# Patient Record
Sex: Male | Born: 1955 | Hispanic: Yes | Smoking: Former smoker
Health system: Southern US, Community
[De-identification: ages and names within clinical notes are randomized; demographics above are authoritative.]

## PROBLEM LIST (undated history)

## (undated) ENCOUNTER — Ambulatory Visit: Payer: Medicare Other

## (undated) DIAGNOSIS — I10 Essential (primary) hypertension: Secondary | ICD-10-CM

## (undated) HISTORY — PX: CARDIAC SURGERY: SHX584

---

## 2010-03-11 DIAGNOSIS — I219 Acute myocardial infarction, unspecified: Secondary | ICD-10-CM

## 2010-03-11 HISTORY — DX: Acute myocardial infarction, unspecified: I21.9

## 2020-04-03 ENCOUNTER — Ambulatory Visit (INDEPENDENT_AMBULATORY_CARE_PROVIDER_SITE_OTHER): Payer: Medicare Other

## 2020-04-03 ENCOUNTER — Ambulatory Visit
Admission: EM | Admit: 2020-04-03 | Discharge: 2020-04-03 | Disposition: A | Discharge status: Home / Self Care | Payer: Medicare Other | Attending: Emergency Medicine | Admitting: Emergency Medicine

## 2020-04-03 ENCOUNTER — Other Ambulatory Visit: Payer: Self-pay

## 2020-04-03 DIAGNOSIS — R1030 Lower abdominal pain, unspecified: Secondary | ICD-10-CM

## 2020-04-03 DIAGNOSIS — R1032 Left lower quadrant pain: Secondary | ICD-10-CM | POA: Diagnosis not present

## 2020-04-03 DIAGNOSIS — K5900 Constipation, unspecified: Secondary | ICD-10-CM | POA: Insufficient documentation

## 2020-04-03 HISTORY — DX: Essential (primary) hypertension: I10

## 2020-04-03 LAB — URINALYSIS, COMPLETE (UACMP) WITH MICROSCOPIC
Bacteria, UA: NONE SEEN
Bilirubin Urine: NEGATIVE
Glucose, UA: NEGATIVE mg/dL
Ketones, ur: NEGATIVE mg/dL
Leukocytes,Ua: NEGATIVE
Nitrite: NEGATIVE
Specific Gravity, Urine: 1.025 (ref 1.005–1.030)
pH: 6.5 (ref 5.0–8.0)

## 2020-04-03 LAB — COMPREHENSIVE METABOLIC PANEL
ALT: 31 U/L (ref 0–44)
AST: 27 U/L (ref 15–41)
Albumin: 3.8 g/dL (ref 3.5–5.0)
Alkaline Phosphatase: 113 U/L (ref 38–126)
Anion gap: 10 (ref 5–15)
BUN: 15 mg/dL (ref 8–23)
CO2: 26 mmol/L (ref 22–32)
Calcium: 8.7 mg/dL — ABNORMAL LOW (ref 8.9–10.3)
Chloride: 100 mmol/L (ref 98–111)
Creatinine, Ser: 0.96 mg/dL (ref 0.61–1.24)
GFR, Estimated: >60 mL/min (ref >60)
Glucose, Bld: 127 mg/dL — ABNORMAL HIGH (ref 70–99)
Potassium: 4.4 mmol/L (ref 3.5–5.1)
Sodium: 136 mmol/L (ref 135–145)
Total Bilirubin: 0.7 mg/dL (ref 0.3–1.2)
Total Protein: 7.2 g/dL (ref 6.5–8.1)

## 2020-04-03 LAB — CBC WITH DIFFERENTIAL/PLATELET
Abs Immature Granulocytes: 0.01 10*3/uL (ref 0.00–0.07)
Basophils Absolute: 0 10*3/uL (ref 0.0–0.1)
Basophils Relative: 1 %
Eosinophils Absolute: 0.3 10*3/uL (ref 0.0–0.5)
Eosinophils Relative: 3 %
HCT: 43.7 % (ref 39.0–52.0)
Hemoglobin: 14.7 g/dL (ref 13.0–17.0)
Immature Granulocytes: 0 %
Lymphocytes Relative: 27 %
Lymphs Abs: 2.2 10*3/uL (ref 0.7–4.0)
MCH: 29.5 pg (ref 26.0–34.0)
MCHC: 33.6 g/dL (ref 30.0–36.0)
MCV: 87.6 fL (ref 80.0–100.0)
Monocytes Absolute: 0.8 10*3/uL (ref 0.1–1.0)
Monocytes Relative: 9 %
Neutro Abs: 4.9 10*3/uL (ref 1.7–7.7)
Neutrophils Relative %: 60 %
Platelets: 270 10*3/uL (ref 150–400)
RBC: 4.99 MIL/uL (ref 4.22–5.81)
RDW: 13.2 % (ref 11.5–15.5)
WBC: 8.2 10*3/uL (ref 4.0–10.5)
nRBC: 0 % (ref 0.0–0.2)

## 2020-04-03 NOTE — ED Triage Notes (Signed)
Pt presents with c/o lower left-sided abdominal pain since yesterday. Pt states pain is worse with movement. Pt denies n/v/d. Pt reports some constipation and his urine is darker than usual. Pt denies any hematuria or blood in stool. Pt denies any abdominal problem history. Pt is accompanies by granddaughter.

## 2020-04-03 NOTE — Discharge Instructions (Signed)
Place half a bottle of MiraLAX in a 64 ounce bottle of Gatorade and drink it over 1 to 2 hours to help relieve the constipation.  If your abdominal pain continues, or worsens, return for reevaluation.

## 2020-04-03 NOTE — ED Provider Notes (Signed)
[facility]    CSN: [id number] Arrival date & time: 04/03/20  4270      History   Chief Complaint Chief Complaint  Patient presents with  . Abdominal Pain    HPI Ronald Morrow is a 65 y.o. male.   HPI   Patient is a 65 year old Spanish-speaking male who is here for evaluation of left lower quadrant pain.  Patient reports that his pain started 2 days ago.  Patient reports that when he use sitting or laying down he does not have any pain but movement causes the pain to increase.  Patient was he is never had any pain like this.  Patient does have a history of constipation, his last BM was yesterday and came about as a result of taking medication.  Patient also reports that he has had decreased urine output and his urine has been darker.  Patient denies fever, nausea, vomiting, diarrhea, blood in his stool, changes in his appetite, or decreased intake of fluid.  Patient does not have a primary care provider.  He has been going to the [facility] in [place] but has not been there in the past 2 years.  Past Medical History:  Diagnosis Date  . Heart attack (HCC) 2012  . Hypertension     There are no problems to display for this patient.   Past Surgical History:  Procedure Laterality Date  . CARDIAC SURGERY         Home Medications    Prior to Admission medications   Medication Sig Start Date End Date Taking? Authorizing Provider  ibuprofen (ADVIL) 200 MG tablet Take 200 mg by mouth every 6 (six) hours as needed.   Yes [provider]    Family History No family history on file.  Social History Social History   Tobacco Use  . Smoking status: Former Games developer  . Smokeless tobacco: Never Used  Vaping Use  . Vaping Use: Never used  Substance Use Topics  . Alcohol use: Never  . Drug use: Never     Allergies   Patient has no known allergies.   Review of Systems Review of Systems  Constitutional: Negative for activity change, appetite  change and fever.  Respiratory: Negative for cough and shortness of breath.   Gastrointestinal: Positive for abdominal pain and constipation. Negative for blood in stool, diarrhea, nausea and vomiting.  Genitourinary: Positive for decreased urine volume. Negative for dysuria, frequency, hematuria and urgency.  Skin: Negative for rash.  Hematological: Negative.   Psychiatric/Behavioral: Negative.      Physical Exam Triage Vital Signs ED Triage Vitals  Enc Vitals Group     BP 04/03/20 1013 (!) 167/79     Pulse Rate 04/03/20 1013 64     Resp 04/03/20 1013 18     Temp 04/03/20 1013 98.1 F (36.7 C)     Temp Source 04/03/20 1013 Oral     SpO2 04/03/20 1013 100 %     Weight 04/03/20 1009 212 lb (96.2 kg)     Height 04/03/20 1009 5\' 6"  (1.676 m)     Head Circumference --      Peak Flow --      Pain Score 04/03/20 1007 5     Pain Loc --      Pain Edu? --      Excl. in GC? --    No data found.  Updated Vital Signs BP (!) 167/79 (BP Location: Left Arm)   Pulse 64   Temp 98.1  F (36.7 C) (Oral)   Resp 18   Ht 5\' 6"  (1.676 m)   Wt 212 lb (96.2 kg)   SpO2 100%   BMI 34.22 kg/m   Visual Acuity Right Eye Distance:   Left Eye Distance:   Bilateral Distance:    Right Eye Near:   Left Eye Near:    Bilateral Near:     Physical Exam Vitals and nursing note reviewed.  Constitutional:      General: He is not in acute distress.    Appearance: Normal appearance. He is well-developed. He is not toxic-appearing.  HENT:     Head: Normocephalic and atraumatic.  Cardiovascular:     Rate and Rhythm: Normal rate and regular rhythm.     Pulses: Normal pulses.     Heart sounds: Normal heart sounds. No murmur heard. No gallop.   Pulmonary:     Effort: Pulmonary effort is normal.     Breath sounds: Normal breath sounds. No wheezing, rhonchi or rales.  Abdominal:     General: There is distension.     Palpations: Abdomen is soft.     Tenderness: There is abdominal tenderness. There  is no right CVA tenderness, left CVA tenderness, guarding or rebound.     Comments: Patient has decreased bowel sounds in all four quadrants.  Patient has tenderness without rebound in the left lower quadrant.  No guarding.  Skin:    General: Skin is warm and dry.     Capillary Refill: Capillary refill takes less than 2 seconds.     Findings: No erythema or rash.  Neurological:     General: No focal deficit present.     Mental Status: He is alert and oriented to person, place, and time.  Psychiatric:        Mood and Affect: Mood normal.        Behavior: Behavior normal.        Thought Content: Thought content normal.        Judgment: Judgment normal.      UC Treatments / Results  Labs (all labs ordered are listed, but only abnormal results are displayed) Labs Reviewed  URINALYSIS, COMPLETE (UACMP) WITH MICROSCOPIC - Abnormal; Notable for the following components:      Result Value   APPearance HAZY (*)    Hgb urine dipstick TRACE (*)    Protein, ur TRACE (*)    All other components within normal limits  COMPREHENSIVE METABOLIC PANEL - Abnormal; Notable for the following components:   Glucose, Bld 127 (*)    Calcium 8.7 (*)    All other components within normal limits  CBC WITH DIFFERENTIAL/PLATELET    EKG   Radiology DG Abdomen Acute W/Chest  Result Date: 04/03/2020 CLINICAL DATA:  Lower abdominal pain and constipation EXAM: DG ABDOMEN ACUTE WITH 1 VIEW CHEST COMPARISON:  None. FINDINGS: PA chest: Lungs are clear. Heart size and pulmonary vascularity are normal. No adenopathy. Patient is status post coronary artery bypass grafting. Supine and upright abdomen: There is moderate stool in the colon. There is no bowel dilatation or air-fluid level to suggest bowel obstruction. No evident free air. No abnormal calcifications. IMPRESSION: Moderate stool in colon. No bowel obstruction or free air. No edema or airspace opacity. Electronically Signed   By: 04/05/2020 III M.D.    On: 04/03/2020 11:01    Procedures Procedures (including critical care time)  Medications Ordered in UC Medications - No data to display  Initial Impression / Assessment and  Plan / UC Course  I have reviewed the triage vital signs and the nursing notes.  Pertinent labs & imaging results that were available during my care of the patient were reviewed by me and considered in my medical decision making (see chart for details).   Patient is here for evaluation of left lower quadrant abdominal pain this been on for the past 2 days.  Patient has a history of constipation and reports that he did have a bowel movement yesterday after taking some medicine.  Patient states he has never had this kind of pain before.  Still associate with fever, nausea, or vomiting.  Patient states he is never had a colonoscopy.  Patient does not have a primary care provider and has not seen any provider in the past 2 years.  Patient had been going to the [facility] in [place] to see a cardiologist.  Patient reports he had open heart surgery approximately 7 years ago.  Patient's physical exam is benign other than decreased bowel sounds in all quadrants and his tenderness to the left lower quadrant.  Patient denies blood in his stool or blood in his urine.  Patient does report that his urine has been darker and has decreased in amount.  Will obtain UA, CBC, CMP, and 3-way abdomen.  CBC is unremarkable.  Urinalysis shows trace hemoglobin, trace bloating, and a specific gravity 1.025 but otherwise unremarkable.  Radiology interpretation of 3-way abdomen is moderate colonic stool burden.  No obstruction or free air.  CMP is remarkable from blood sugar of 127 and calcium level 8.7 but is otherwise normal.  We will discharge patient home with a diagnosis of left lower quadrant pain secondary to constipation.  Have patient perform a cleanout using MiraLAX and see if this helps his symptoms.  Patient will be advised to return  for reevaluation if his symptoms do not improve, or go to the ER if his symptoms worsen.     Final Clinical Impressions(s) / UC Diagnoses   Final diagnoses:  Abdominal pain, left lower quadrant  Constipation, unspecified constipation type     Discharge Instructions     Place half a bottle of MiraLAX in a 64 ounce bottle of Gatorade and drink it over 1 to 2 hours to help relieve the constipation.  If your abdominal pain continues, or worsens, return for reevaluation.    ED Prescriptions    None     PDMP not reviewed this encounter.   Becky Augusta, NP 04/03/20 1209

## 2021-11-11 IMAGING — CR DG ABDOMEN ACUTE W/ 1V CHEST
5 series · 6 of 6 positions shown · non-contrast
Comparison: None.

CLINICAL DATA: Lower abdominal pain and constipation

EXAM:
DG ABDOMEN ACUTE WITH 1 VIEW CHEST

[Series 1: chest pa · 0.14mm/px · 2 of 2 slices shown]
[im 1/2]
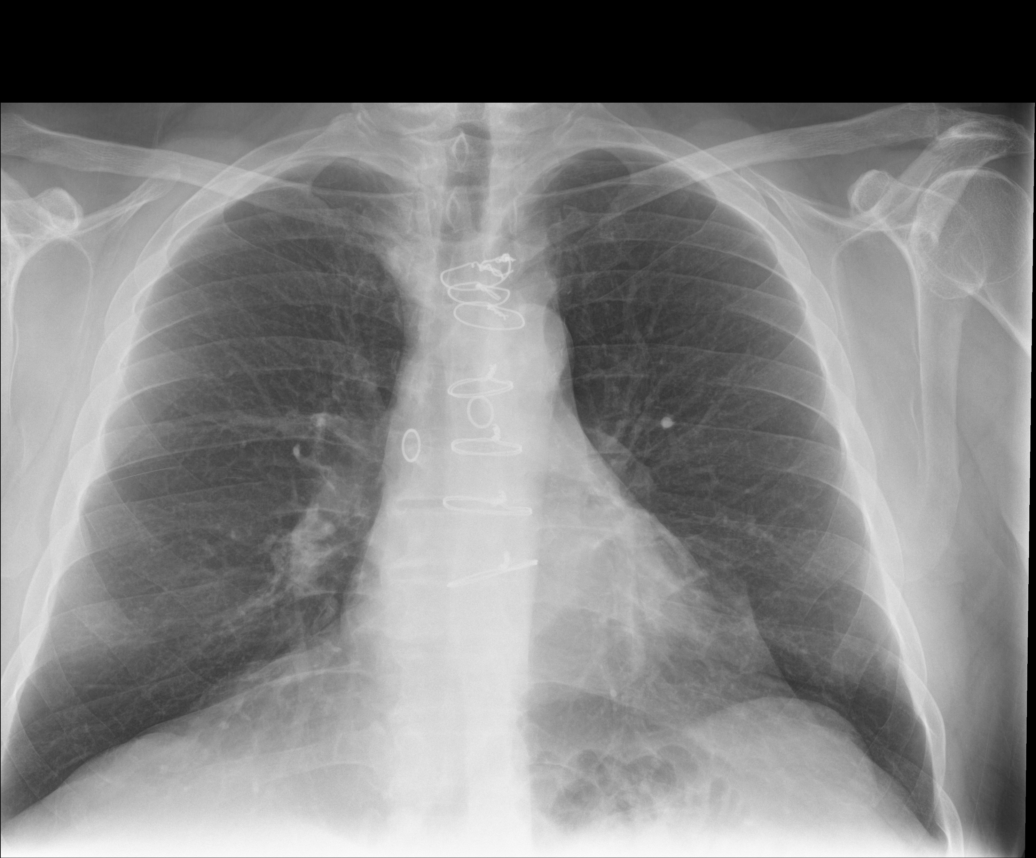
[im 2/2]
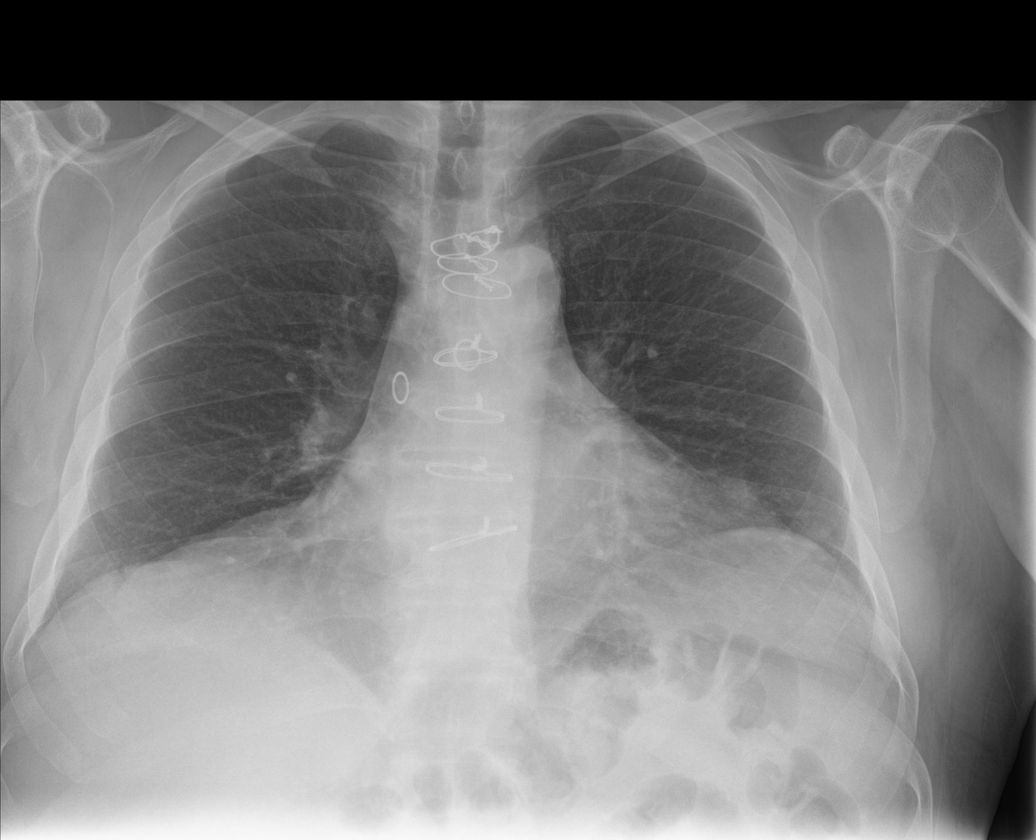

[abdomen erect (1 of 2)]
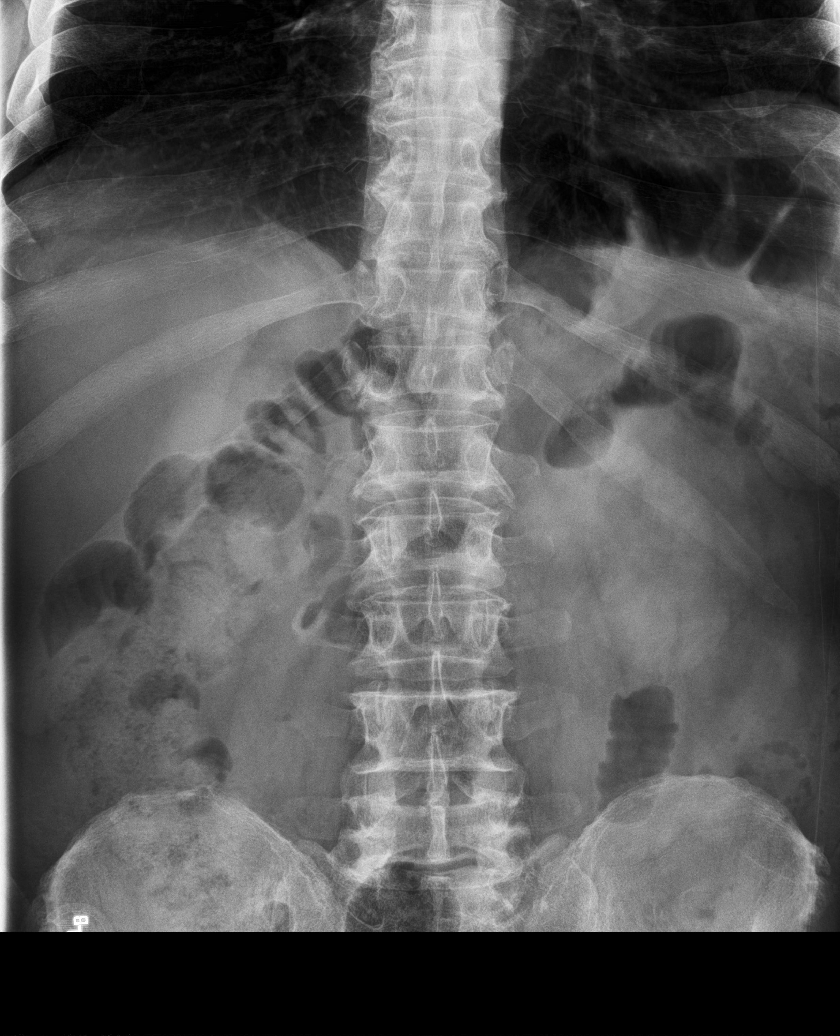

[abdomen erect (2 of 2)]
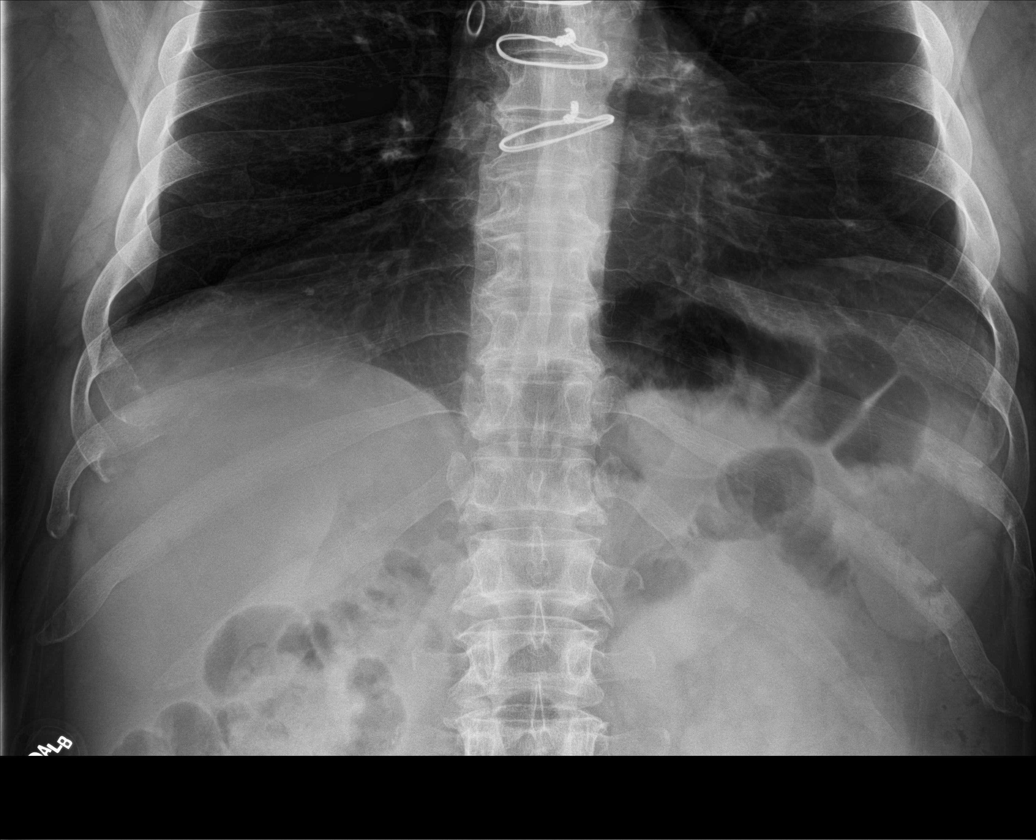

[abdomen kub (1 of 2)]
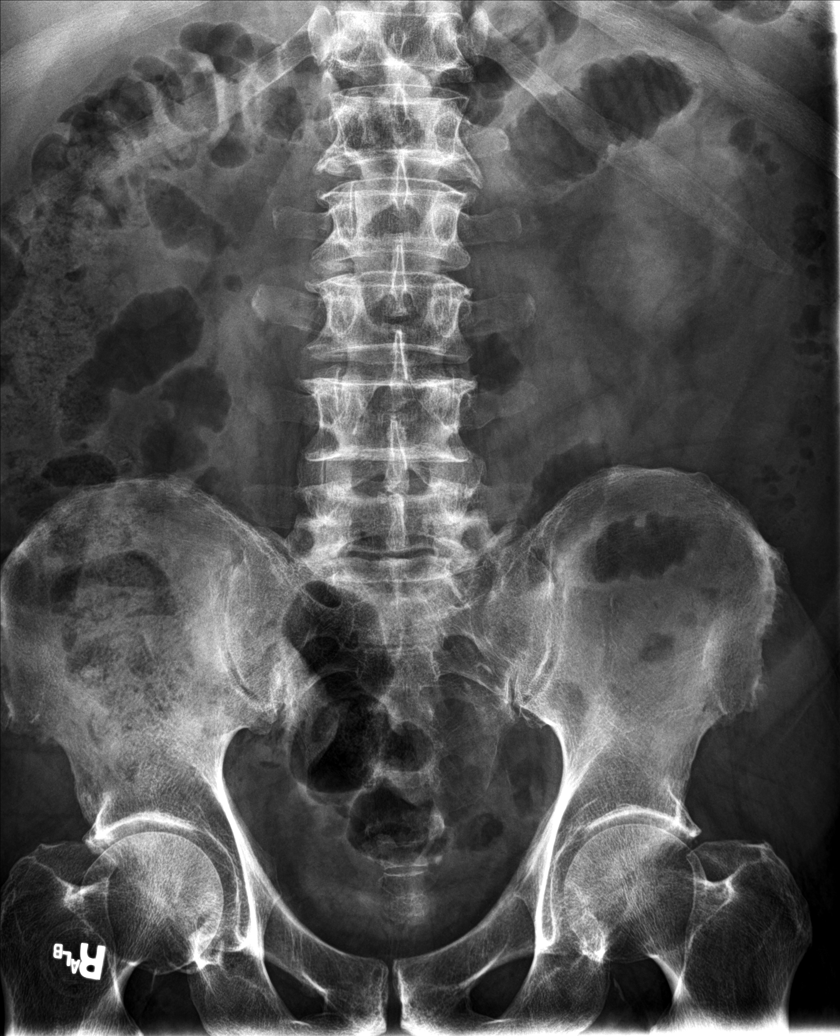

[abdomen kub (2 of 2)]
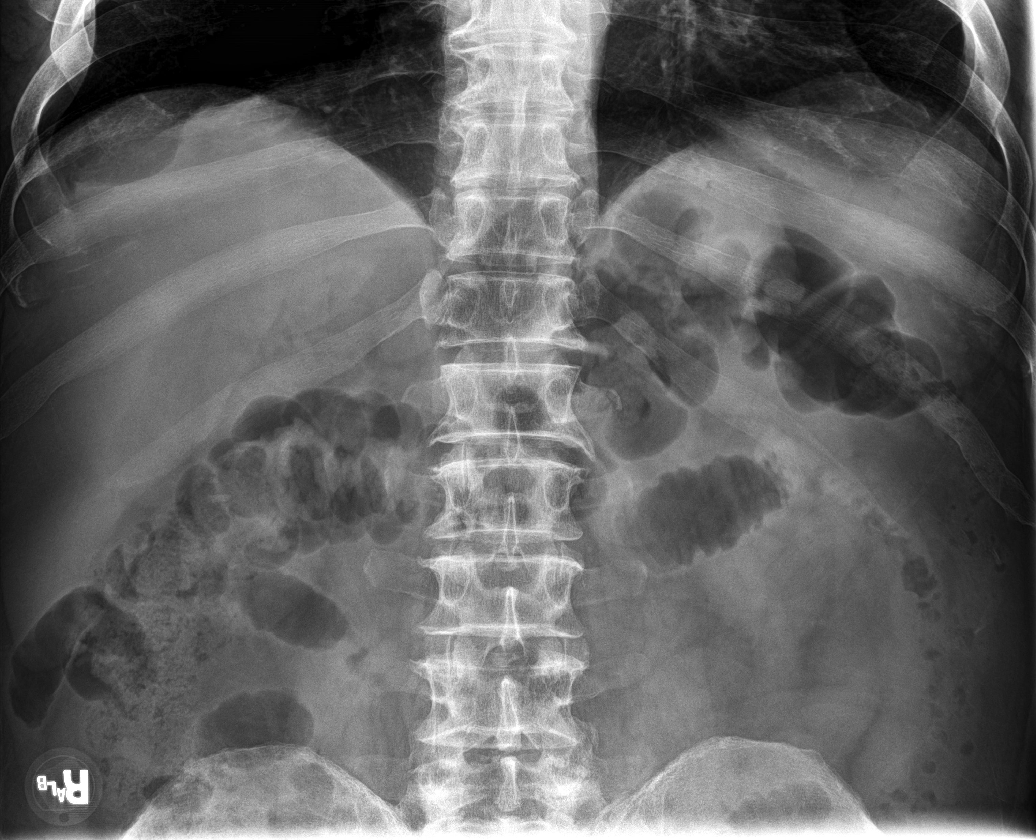

[6 of 6 positions shown; findings below may reference images not displayed]

FINDINGS: PA chest: Lungs are clear. Heart size and pulmonary vascularity are
normal. No adenopathy. Patient is status post coronary artery bypass
grafting.

Supine and upright abdomen: There is moderate stool in the colon.
There is no bowel dilatation or air-fluid level to suggest bowel
obstruction. No evident free air. No abnormal calcifications.
IMPRESSION: Moderate stool in colon. No bowel obstruction or free air. No edema
or airspace opacity.

## 2022-03-14 ENCOUNTER — Ambulatory Visit
Admission: EM | Admit: 2022-03-14 | Discharge: 2022-03-14 | Disposition: A | Discharge status: Home / Self Care | Payer: Medicare Other | Attending: Emergency Medicine | Admitting: Emergency Medicine

## 2022-03-14 DIAGNOSIS — Z20822 Contact with and (suspected) exposure to covid-19: Secondary | ICD-10-CM | POA: Diagnosis present

## 2022-03-14 DIAGNOSIS — J029 Acute pharyngitis, unspecified: Secondary | ICD-10-CM | POA: Insufficient documentation

## 2022-03-14 LAB — RESP PANEL BY RT-PCR (FLU A&B, COVID) ARPGX2
Influenza A by PCR: NEGATIVE
Influenza B by PCR: NEGATIVE
SARS Coronavirus 2 by RT PCR: NEGATIVE

## 2022-03-14 LAB — GROUP A STREP BY PCR: Group A Strep by PCR: NOT DETECTED

## 2022-03-14 MED ORDER — PROMETHAZINE-DM 6.25-15 MG/5ML PO SYRP: 5.0000 mL | ORAL_SOLUTION | Freq: Four times a day (QID) | ORAL | 0 refills | Status: AC | PRN

## 2022-03-14 MED ORDER — IPRATROPIUM BROMIDE 0.06 % NA SOLN: 2.0000 | Freq: Four times a day (QID) | NASAL | 0 refills | Status: AC

## 2022-03-14 NOTE — Discharge Instructions (Signed)
Your COVID, flu, strep PCR testing were negative today.  1 gram of Tylenol and 400 mg ibuprofen together 3-4 times a day as needed for pain.  Make sure you drink plenty of extra fluids.  Some people find salt water gargles and  Traditional Medicinal's "Throat Coat" tea helpful. Take 5 mL of liquid Benadryl and 5 mL of Maalox. Mix it together, and then hold it in your mouth for as long as you can and then swallow. You may do this 4 times a day.  Honey and lemon dissolved in hot water can also be soothing.  Promethazine DM for cough, saline nasal irrigation with a NeilMed sinus rinse and distilled water as often as you want, Atrovent nasal spray for nasal congestion.  Go to www.goodrx.com  or www.costplusdrugs.com to look up your medications. This will give you a list of where you can find your prescriptions at the most affordable prices. Or ask the pharmacist what the cash price is, or if they have any other discount programs available to help make your medication more affordable. This can be less expensive than what you would pay with insurance.

## 2022-03-14 NOTE — ED Provider Notes (Signed)
HPI  SUBJECTIVE:  Patient reports sore throat starting 3 days ago. Sx worse with going outside.  Sx better with Advil.  + Has felt feverish, but has not measured his temperature   No neck stiffness  + Cough, wheezing "in my throat" + Mild nasal congestion, rhinorrhea, no postnasal drip No Myalgias + Headache No Rash  No loss of taste or smell + Very mild shortness of breath last night due to throat swelling, but this has resolved.   No nausea, vomiting No diarrhea No abdominal pain     No Recent Strep,  flu, COVID exposure He got 3 doses of the COVID-vaccine and this years flu vaccine No reflux sxs No Allergy sxs  No Breathing difficulty, even when his throat felt swollen, voice changes, sensation of throat swelling shut No Drooling No Trismus No abx in past month. All immunizations UTD.  + antipyretic in past 6 hrs-took Advil Past medical history of hypertension, hypercholesterolemia, MI PCP: [facility] [place] All history obtained through video interpreter.   Past Medical History:  Diagnosis Date   Heart attack ([place]) 2012   Hypertension     Past Surgical History:  Procedure Laterality Date   CARDIAC SURGERY      History reviewed. No pertinent family history.  Social History   Tobacco Use   Smoking status: Former   Smokeless tobacco: Never  Scientific laboratory technician Use: Never used  Substance Use Topics   Alcohol use: Never   Drug use: Never    No current facility-administered medications for this encounter.  Current Outpatient Medications:    aspirin EC 81 MG tablet, Take 1 tablet by mouth daily., Disp: , Rfl:    atorvastatin (LIPITOR) 20 MG tablet, Take 20 mg by mouth daily., Disp: , Rfl:    ipratropium (ATROVENT) 0.06 % nasal spray, Place 2 sprays into both nostrils 4 (four) times daily., Disp: 15 mL, Rfl: 0   losartan (COZAAR) 50 MG tablet, Take 50 mg by mouth daily., Disp: , Rfl:    promethazine-dextromethorphan (PROMETHAZINE-DM) 6.25-15 MG/5ML  syrup, Take 5 mLs by mouth 4 (four) times daily as needed for cough., Disp: 118 mL, Rfl: 0  No Known Allergies   ROS  As noted in HPI.   Physical Exam  BP (!) 156/81 (BP Location: Left Arm)   Pulse 66   Temp 98.3 F (36.8 C) (Oral)   Resp 16   Ht 5' (1.524 m)   Wt 95.3 kg   SpO2 96%   BMI 41.01 kg/m   Constitutional: Well developed, well nourished, no acute distress Eyes:  EOMI, conjunctiva normal bilaterally HENT: Normocephalic, atraumatic,mucus membranes moist.  Positive nasal congestion.  Erythematous, swollen turbinates.  Clear rhinorrhea.  Very mild frontal sinus tenderness.  Erythematous oropharynx, tonsils normal size without exudates. Uvula midline.  Respiratory: Normal inspiratory effort, lungs clear bilaterally Cardiovascular: Normal rate, no murmurs, rubs, gallops GI: nondistended, nontender.  skin: No rash, skin intact Lymph: Positive anterior cervical LN.  No posterior cervical lymphadenopathy Musculoskeletal: no deformities Neurologic: Alert & oriented x 3, no focal neuro deficits Psychiatric: Speech and behavior appropriate.   ED Course   Medications - No data to display  Orders Placed This Encounter  Procedures   Group A Strep by PCR    Standing Status:   Standing    Number of Occurrences:   1   Resp Panel by RT-PCR (Flu A&B, Covid) Anterior Nasal Swab    Standing Status:   Standing  Number of Occurrences:   1    Results for orders placed or performed during the hospital encounter of 03/14/22 (from the past 24 hour(s))  Group A Strep by PCR     Status: None   Collection Time: 03/14/22 12:38 PM   Specimen: Throat; Sterile Swab  Result Value Ref Range   Group A Strep by PCR NOT DETECTED NOT DETECTED  Resp Panel by RT-PCR (Flu A&B, Covid) Anterior Nasal Swab     Status: None   Collection Time: 03/14/22 12:38 PM   Specimen: Anterior Nasal Swab  Result Value Ref Range   SARS Coronavirus 2 by RT PCR NEGATIVE NEGATIVE   Influenza A by PCR  NEGATIVE NEGATIVE   Influenza B by PCR NEGATIVE NEGATIVE   No results found.  ED Clinical Impression  1. Pharyngitis, unspecified etiology   2. Encounter for laboratory testing for COVID-19 virus      ED Assessment/Plan    COVID, influenza, strep PCR testing negative.  Presentation consistent with a viral pharyngitis/URI.  No concern for deep space infection of the neck.  Home with Benadryl/Maalox mixture, Promethazine DM, saline nasal medication, Atrovent nasal spray, Tylenol/ibuprofen.  Follow-up with PCP as needed.    Using the video interpreter discussed labs,  MDM, plan and followup with patient. Discussed sn/sx that should prompt return to the ED. answered all questions.  patient agrees with plan.   Meds ordered this encounter  Medications   ipratropium (ATROVENT) 0.06 % nasal spray    Sig: Place 2 sprays into both nostrils 4 (four) times daily.    Dispense:  15 mL    Refill:  0   promethazine-dextromethorphan (PROMETHAZINE-DM) 6.25-15 MG/5ML syrup    Sig: Take 5 mLs by mouth 4 (four) times daily as needed for cough.    Dispense:  118 mL    Refill:  0     *This clinic note was created using [employer]. Therefore, there may be occasional mistakes despite careful proofreading.     Melynda Ripple, MD 03/16/22 1413

## 2022-03-14 NOTE — ED Triage Notes (Signed)
Pt c/o sore throat,bodyaches,cough & fever x3 days.
